# Patient Record
Sex: Male | Born: 1960 | Race: White | Hispanic: No | Marital: Married | Smoking: Current every day smoker
Health system: Southern US, Community
[De-identification: ages and names within clinical notes are randomized; demographics above are authoritative.]

## PROBLEM LIST (undated history)

## (undated) DIAGNOSIS — G8929 Other chronic pain: Secondary | ICD-10-CM

## (undated) DIAGNOSIS — F329 Major depressive disorder, single episode, unspecified: Secondary | ICD-10-CM

## (undated) DIAGNOSIS — R51 Headache: Secondary | ICD-10-CM

## (undated) DIAGNOSIS — G4733 Obstructive sleep apnea (adult) (pediatric): Secondary | ICD-10-CM

## (undated) DIAGNOSIS — H35712 Central serous chorioretinopathy, left eye: Secondary | ICD-10-CM

## (undated) DIAGNOSIS — I10 Essential (primary) hypertension: Secondary | ICD-10-CM

## (undated) DIAGNOSIS — H359 Unspecified retinal disorder: Secondary | ICD-10-CM

## (undated) DIAGNOSIS — M25559 Pain in unspecified hip: Secondary | ICD-10-CM

## (undated) DIAGNOSIS — A749 Chlamydial infection, unspecified: Secondary | ICD-10-CM

## (undated) DIAGNOSIS — Z8669 Personal history of other diseases of the nervous system and sense organs: Secondary | ICD-10-CM

## (undated) DIAGNOSIS — E669 Obesity, unspecified: Secondary | ICD-10-CM

## (undated) DIAGNOSIS — K219 Gastro-esophageal reflux disease without esophagitis: Secondary | ICD-10-CM

## (undated) DIAGNOSIS — F32A Depression, unspecified: Secondary | ICD-10-CM

## (undated) HISTORY — PX: LAMINECTOMY AND MICRODISCECTOMY LUMBAR SPINE: SHX1913

## (undated) HISTORY — PX: HERNIA REPAIR: SHX51

## (undated) HISTORY — PX: ANTERIOR FUSION SPINAL DEFORMITY: SUR35

---

## 2000-08-25 HISTORY — PX: OTHER SURGICAL HISTORY: SHX169

## 2012-07-25 HISTORY — PX: OTHER SURGICAL HISTORY: SHX169

## 2014-07-11 ENCOUNTER — Ambulatory Visit: Payer: Self-pay | Admitting: Unknown Physician Specialty

## 2016-07-25 IMAGING — MR MRI LUMBAR SPINE WITHOUT CONTRAST
4 of 5 series · 24 of 48 positions shown · non-contrast
Comparison: None.

CLINICAL DATA: Low back and left hip and leg pain for 2 years,
worsening over the past year. History of lumbar surgery in 6660.

EXAM:
MRI LUMBAR SPINE WITHOUT CONTRAST
TECHNIQUE: Multiplanar, multisequence MR imaging of the lumbar spine was
performed. No intravenous contrast was administered.

[Series 2: T2 · sagittal · 4.0mm · 0.81mm/px · 6 of 15 slices shown (1 of 2)]
[im 1/15]
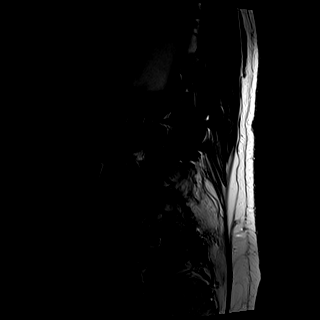
[im 3/15]
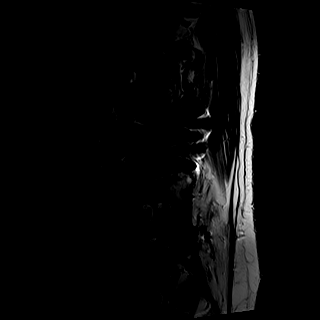
[im 6/15]
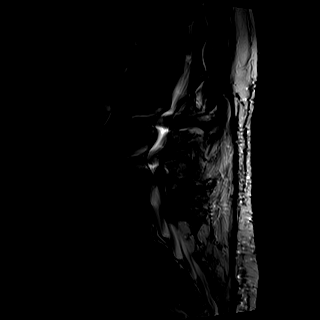
[im 9/15]
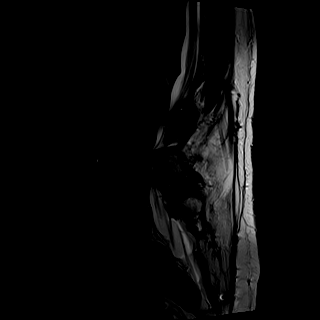
[im 12/15]
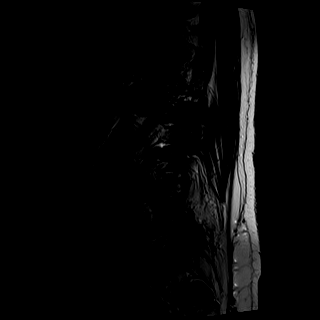
[im 15/15]
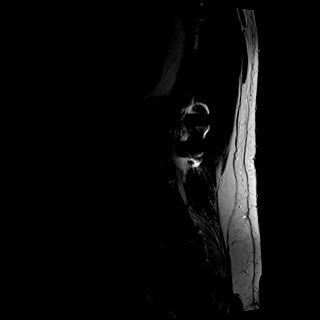

[Series 3: T1 · sagittal · 4.0mm · 0.41mm/px · 6 of 15 slices shown (1 of 2)]
[im 1/15]
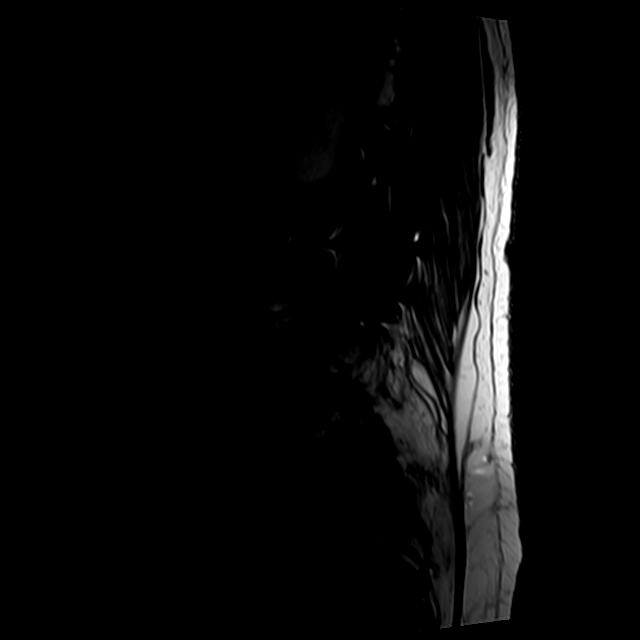
[im 3/15]
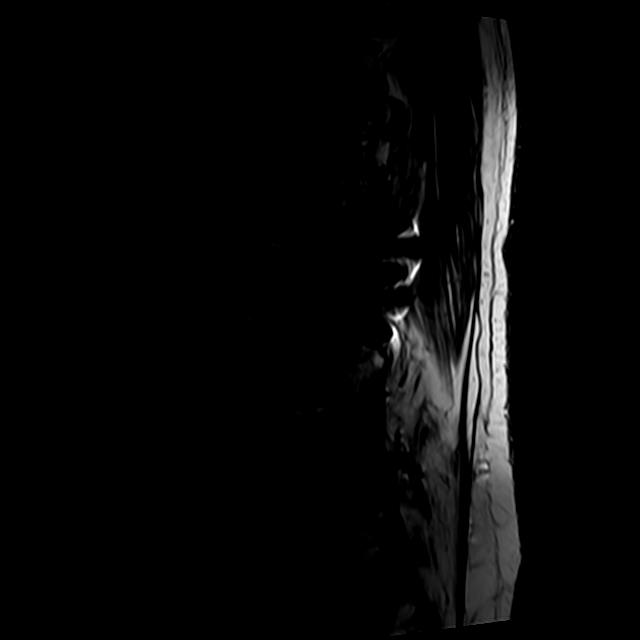
[im 6/15]
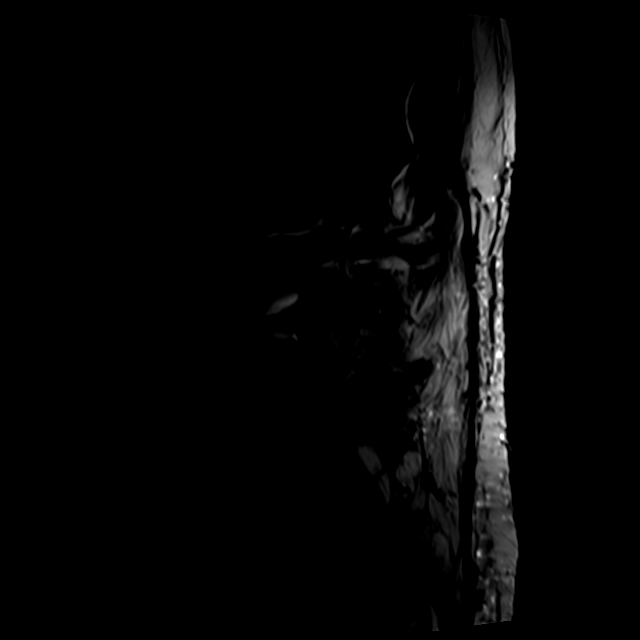
[im 9/15]
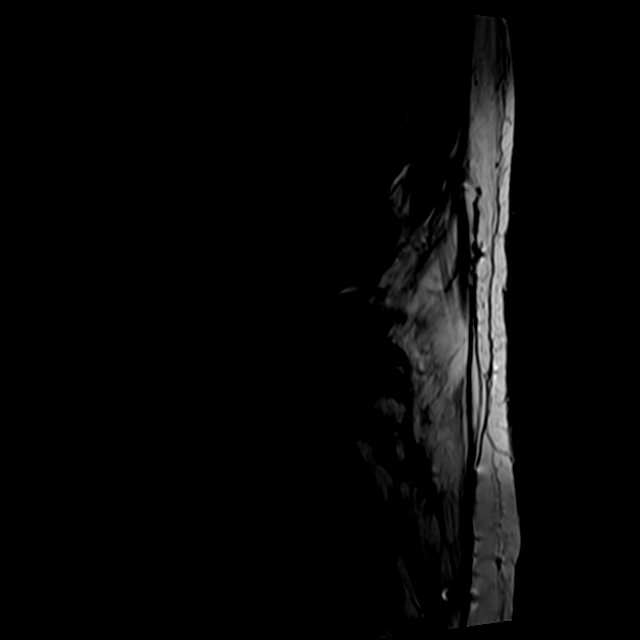
[im 12/15]
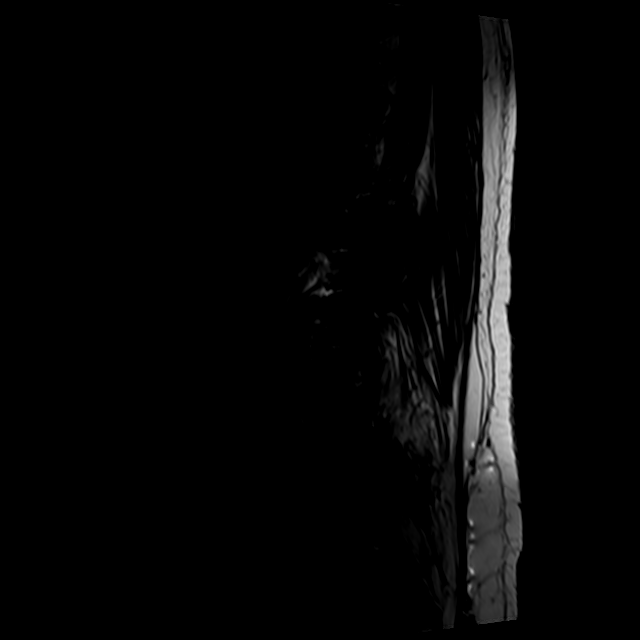
[im 15/15]
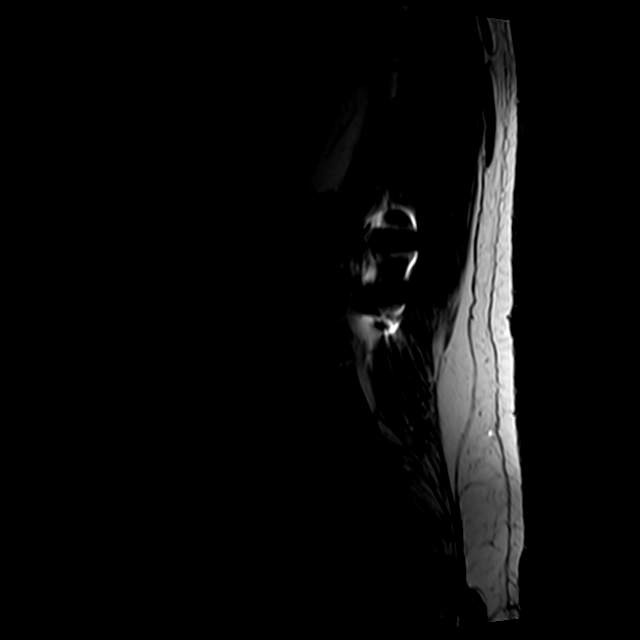

[Series 5: T2 · axial · 4.0mm · 0.78mm/px · z∈[-58,+161]mm · 9 of 39 slices shown (2 of 2)]
[im 1/39]
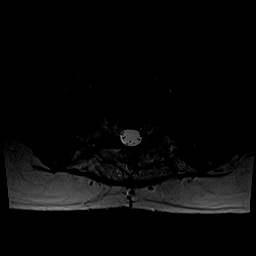
[im 6/39]
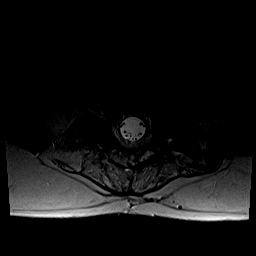
[im 11/39]
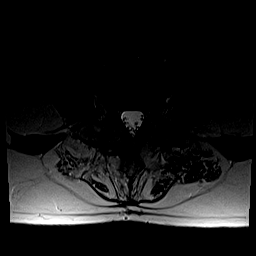
[im 17/39]
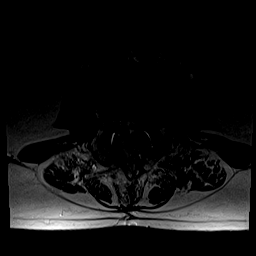
[im 20/39]
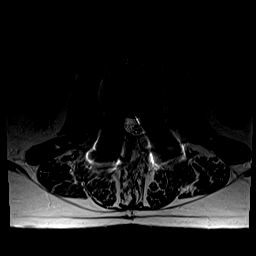
[im 22/39]
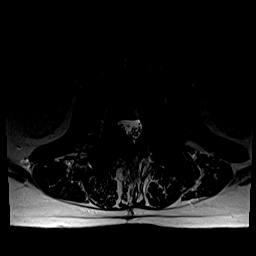
[im 28/39]
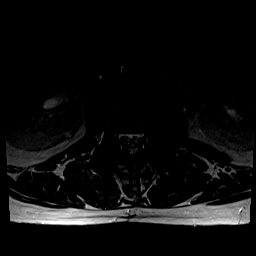
[im 33/39]
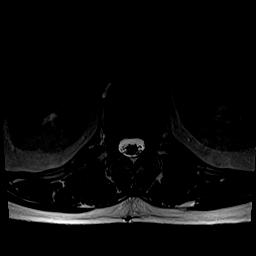
[im 39/39]
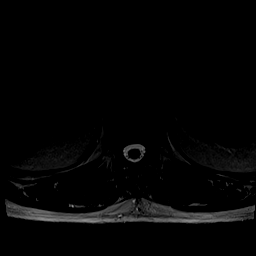

[Series 6: T1 · axial · 4.0mm · 0.31mm/px · z∈[-34,+131]mm · 3 of 39 slices shown (2 of 2)]
[im 6/39]
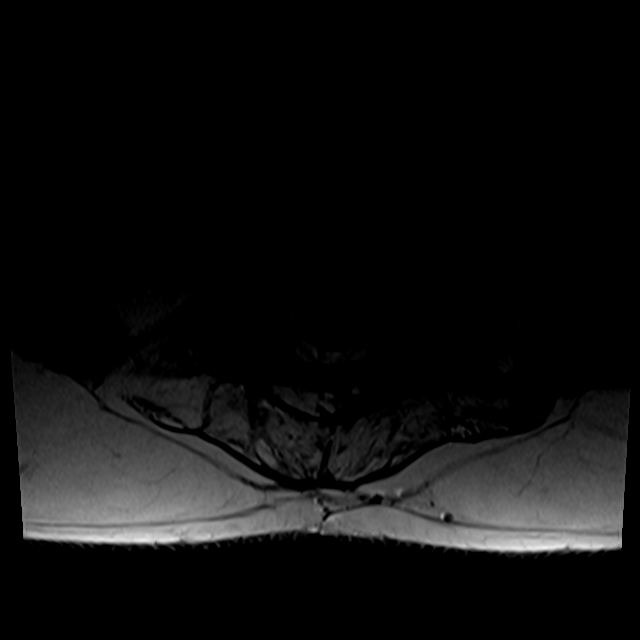
[im 20/39]
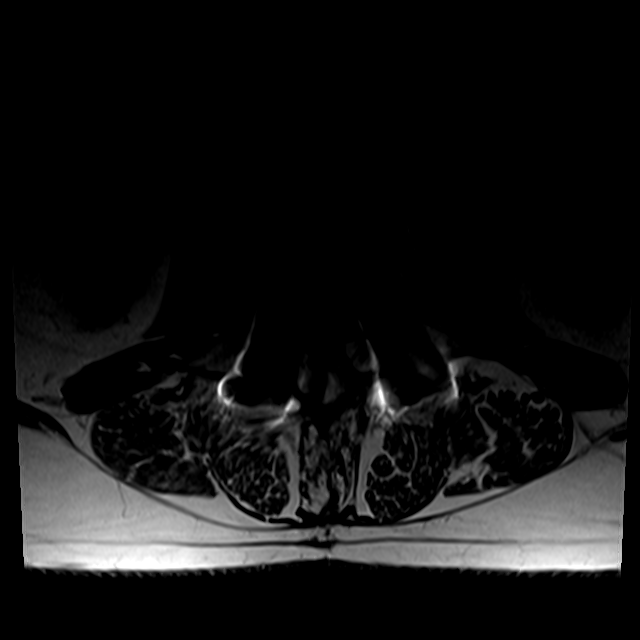
[im 33/39]
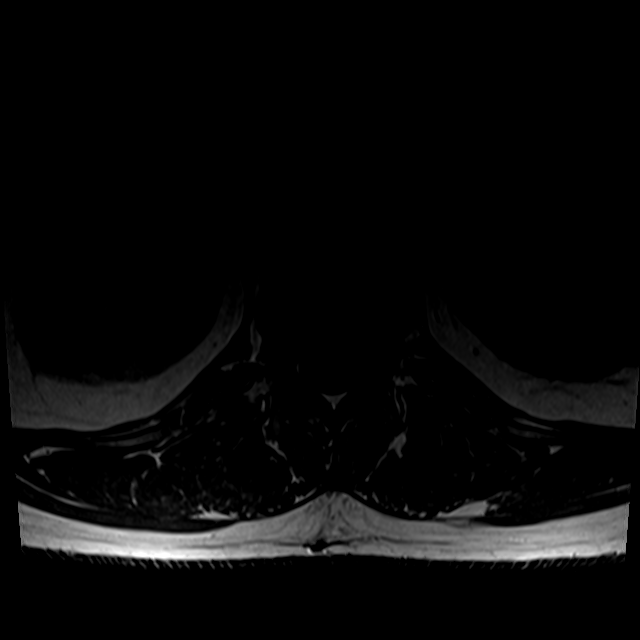

[24 of 48 positions shown; findings below may reference images not displayed]

FINDINGS: Vertebral body height is maintained. There is approximately 0.6 cm
anterolisthesis L5 on S1 and trace retrolisthesis L1 on L2. Pedicle
screws and stabilization bars are in place at L2-3. There is no
worrisome marrow lesion. Vertebral body height is maintained. The
conus medullaris is normal in signal and position. Imaged
intra-abdominal contents are unremarkable.

The T11-12 level is imaged in the sagittal plane only and negative.

T12-L1: Minimal disc bulge without central canal or foraminal
narrowing.

L1-2: Shallow disc bulge without central canal or foraminal
stenosis.

L2-3: Status post fusion. Solid fusion mass posteriorly is
identified. Minimal endplate spurring is present but the central
canal and foramina are widely patent.

L3-4: Moderate to advanced facet degenerative disease is present
with bulky ligamentum flavum thickening. There is a disc bulge.
Severe central canal stenosis is identified. The foramina are open.

L4-5: Solid posterior fusion mass is seen. Minimal disc bulge
without central canal or foraminal narrowing.

L5-S1: Status post laminectomy and fusion. The level is solidly
fused. The central canal and foramina are widely patent.
IMPRESSION: Severe central canal stenosis at L3-4 due to bulky ligamentum flavum
thickening and a shallow disc bulge.

Postoperative change L2-3, L4-5 and L5-S1 without complicating
feature. The central canal and foramina are widely patent.

## 2016-08-12 ENCOUNTER — Encounter: Payer: Self-pay | Admitting: *Deleted

## 2016-08-15 ENCOUNTER — Encounter
Admission: RE | Disposition: A | Discharge status: Home / Self Care | Payer: Self-pay | Source: Ambulatory Visit | Attending: Gastroenterology

## 2016-08-15 ENCOUNTER — Ambulatory Visit
Admission: RE | Admit: 2016-08-15 | Discharge: 2016-08-15 | Disposition: A | Discharge status: Home / Self Care | Payer: Medicare Other | Source: Ambulatory Visit | Attending: Gastroenterology | Admitting: Gastroenterology

## 2016-08-15 ENCOUNTER — Encounter: Payer: Self-pay | Admitting: *Deleted

## 2016-08-15 ENCOUNTER — Ambulatory Visit: Payer: Medicare Other | Admitting: Anesthesiology

## 2016-08-15 DIAGNOSIS — Z79899 Other long term (current) drug therapy: Secondary | ICD-10-CM | POA: Insufficient documentation

## 2016-08-15 DIAGNOSIS — Z6835 Body mass index (BMI) 35.0-35.9, adult: Secondary | ICD-10-CM | POA: Insufficient documentation

## 2016-08-15 DIAGNOSIS — I10 Essential (primary) hypertension: Secondary | ICD-10-CM | POA: Insufficient documentation

## 2016-08-15 DIAGNOSIS — F17200 Nicotine dependence, unspecified, uncomplicated: Secondary | ICD-10-CM | POA: Insufficient documentation

## 2016-08-15 DIAGNOSIS — Z1211 Encounter for screening for malignant neoplasm of colon: Secondary | ICD-10-CM | POA: Insufficient documentation

## 2016-08-15 DIAGNOSIS — G4733 Obstructive sleep apnea (adult) (pediatric): Secondary | ICD-10-CM | POA: Insufficient documentation

## 2016-08-15 DIAGNOSIS — E669 Obesity, unspecified: Secondary | ICD-10-CM | POA: Insufficient documentation

## 2016-08-15 HISTORY — DX: Obstructive sleep apnea (adult) (pediatric): G47.33

## 2016-08-15 HISTORY — DX: Chlamydial infection, unspecified: A74.9

## 2016-08-15 HISTORY — DX: Headache: R51

## 2016-08-15 HISTORY — DX: Major depressive disorder, single episode, unspecified: F32.9

## 2016-08-15 HISTORY — DX: Central serous chorioretinopathy, left eye: H35.712

## 2016-08-15 HISTORY — DX: Depression, unspecified: F32.A

## 2016-08-15 HISTORY — DX: Obesity, unspecified: E66.9

## 2016-08-15 HISTORY — DX: Essential (primary) hypertension: I10

## 2016-08-15 HISTORY — DX: Gastro-esophageal reflux disease without esophagitis: K21.9

## 2016-08-15 HISTORY — DX: Other chronic pain: G89.29

## 2016-08-15 HISTORY — PX: COLONOSCOPY WITH PROPOFOL: SHX5780

## 2016-08-15 SURGERY — COLONOSCOPY WITH PROPOFOL
Anesthesia: General

## 2016-08-15 MED ORDER — PROPOFOL 10 MG/ML IV BOLUS
INTRAVENOUS | Status: DC | PRN
  Administered 2016-08-15: 50 mg via INTRAVENOUS

## 2016-08-15 MED ORDER — PROPOFOL 500 MG/50ML IV EMUL
INTRAVENOUS | Status: AC
  Filled 2016-08-15: qty 50

## 2016-08-15 MED ORDER — SODIUM CHLORIDE 0.9 % IV SOLN
INTRAVENOUS | Status: DC
  Administered 2016-08-15: 1000 mL via INTRAVENOUS
  Administered 2016-08-15: 08:00:00 via INTRAVENOUS

## 2016-08-15 MED ORDER — PROPOFOL 500 MG/50ML IV EMUL
INTRAVENOUS | Status: DC | PRN
  Administered 2016-08-15: 140 ug/kg/min via INTRAVENOUS

## 2016-08-15 MED ORDER — FENTANYL CITRATE (PF) 100 MCG/2ML IJ SOLN
INTRAMUSCULAR | Status: AC
  Filled 2016-08-15: qty 2

## 2016-08-15 MED ORDER — MIDAZOLAM HCL 2 MG/2ML IJ SOLN
INTRAMUSCULAR | Status: AC
  Filled 2016-08-15: qty 2

## 2016-08-15 MED ORDER — MIDAZOLAM HCL 2 MG/2ML IJ SOLN
INTRAMUSCULAR | Status: DC | PRN
Start: 2016-08-15 — End: 2016-08-15
  Administered 2016-08-15: 2 mg via INTRAVENOUS

## 2016-08-15 MED ORDER — FENTANYL CITRATE (PF) 100 MCG/2ML IJ SOLN
INTRAMUSCULAR | Status: DC | PRN
  Administered 2016-08-15: 50 ug via INTRAVENOUS

## 2016-08-15 MED ORDER — SODIUM CHLORIDE 0.9 % IV SOLN: INTRAVENOUS | Status: DC

## 2016-08-15 NOTE — H&P (Signed)
Outpatient short stay form Pre-procedure 08/15/2016 7:27 AM [name] MD  Primary Physician:  Gaetano Net, NP  Reason for visit:  Colonoscopy  History of present illness:  Patient is a 56 year old male presenting today as above. He's never had a colonoscopy in the past. He tolerated his prep well. He takes no aspirin or blood thinning agents.    Current Facility-Administered Medications:  .  0.9 %  sodium chloride infusion, , Intravenous, Continuous, [name], MD, Last Rate: 20 mL/hr at 08/15/16 0704, 1,000 mL at 08/15/16 0704 .  0.9 %  sodium chloride infusion, , Intravenous, Continuous, [name], MD  Prescriptions Prior to Admission  Medication Sig Dispense Refill Last Dose  . cetirizine (ZYRTEC) 10 MG tablet Take 10 mg by mouth daily.   08/14/2016 at Unknown time  . naproxen sodium (ANAPROX) 220 MG tablet Take 220 mg by mouth 2 (two) times daily with a meal. 2 TABLETS BY MOUTH AS NEEDED   08/14/2016 at Unknown time  . valsartan-hydrochlorothiazide (DIOVAN-HCT) 320-25 MG tablet Take 1 tablet by mouth daily.   08/14/2016 at Unknown time     Allergies  Allergen Reactions  . Ace Inhibitors      Past Medical History:  Diagnosis Date  . Central serous retinopathy, left   . Chlamydia FRACTURE OF METACARPAL BONE  . Chronic headaches   . Depression   . GERD (gastroesophageal reflux disease) COLOR BLINDNESS  . Hypertension   . Obesity   . OSA (obstructive sleep apnea)     Review of systems:      Physical Exam    Heart and lungs: Regular rate and rhythm without rub or gallop, lungs are bilaterally clear.    HEENT: Normocephalic atraumatic eyes are anicteric    Other:     Pertinant exam for procedure: Soft nontender nondistended bowel sounds positive normoactive, protuberant.    Planned proceedures: Colonoscopy and indicated procedures. I have discussed the risks benefits and complications of procedures to include not limited to bleeding,  infection, perforation and the risk of sedation and the patient wishes to proceed.    [name], MD Gastroenterology 08/15/2016  7:27 AM

## 2016-08-15 NOTE — Anesthesia Post-op Follow-up Note (Cosign Needed)
Anesthesia QCDR form completed.        

## 2016-08-15 NOTE — Anesthesia Postprocedure Evaluation (Signed)
Anesthesia Post Note  Patient: Steven Downs  Procedure(s) Performed: Procedure(s) (LRB): COLONOSCOPY WITH PROPOFOL (N/A)  Patient location during evaluation: Endoscopy Anesthesia Type: General Level of consciousness: awake and alert Pain management: pain level controlled Vital Signs Assessment: post-procedure vital signs reviewed and stable Respiratory status: spontaneous breathing, nonlabored ventilation, respiratory function stable and patient connected to nasal cannula oxygen Cardiovascular status: blood pressure returned to baseline and stable Postop Assessment: no signs of nausea or vomiting Anesthetic complications: no     Last Vitals:  Vitals:   08/15/16 0810 08/15/16 0820  BP: 123/87 100/68  Pulse: 64 61  Resp: 13 11  Temp:      Last Pain:  Vitals:   08/15/16 0800  TempSrc: Tympanic                 [name]

## 2016-08-15 NOTE — Op Note (Signed)
[facility] Gastroenterology Patient Name: Steven Downs Procedure Date: 08/15/2016 7:34 AM MRN: [id number] Account #: [id number] Date of Birth: [birth year] Admit Type: Outpatient Age: 56 Room: [facility] ENDO ROOM 3 Gender: Male Note Status: Finalized Procedure:            Colonoscopy Indications:          Screening for colorectal malignant neoplasm Providers:            [name], MD Referring MD:         Juluis Rainier (Referring MD) Medicines:            Monitored Anesthesia Care Complications:        No immediate complications. Procedure:            Pre-Anesthesia Assessment:                       - ASA Grade Assessment: III - A patient with severe                        systemic disease.                       After obtaining informed consent, the colonoscope was                        passed under direct vision. Throughout the procedure,                        the patient's blood pressure, pulse, and oxygen                        saturations were monitored continuously. The                        Colonoscope was introduced through the anus with the                        intention of advancing to the cecum. The scope was                        advanced to the sigmoid colon before the procedure was                        aborted. Medications were given. The colonoscopy was                        unusually difficult due to poor bowel prep. The patient                        tolerated the procedure well. The quality of the bowel                        preparation was poor. Findings:      A large amount of semi-liquid stool was found in the rectum, in the       sigmoid colon and in the descending colon, precluding visualization. Impression:           - Preparation of the colon was poor.                       -  Stool in the rectum, in the sigmoid colon and in the                        descending colon.                       - No specimens  collected. Recommendation:       - Discharge patient to home.                       - Put patient on a clear liquid diet starting today.                       - repeat prep and reschedule procedure. Procedure Code(s):    --- Professional ---                       [phone], 46, Colonoscopy, flexible; diagnostic, including                        collection of specimen(s) by brushing or washing, when                        performed (separate procedure) Diagnosis Code(s):    --- Professional ---                       Z12.11, Encounter for screening for malignant neoplasm                        of colon CPT copyright 2016 [employer]. All rights reserved. The codes documented in this report are preliminary and upon coder review may  be revised to meet current compliance requirements. [name], MD 08/15/2016 7:51:51 AM This report has been signed electronically. Number of Addenda: 0 Note Initiated On: 08/15/2016 7:34 AM      [facility]

## 2016-08-15 NOTE — Anesthesia Preprocedure Evaluation (Signed)
Anesthesia Evaluation  Patient identified by MRN, date of birth, ID band Patient awake    Reviewed: Allergy & Precautions, H&P , NPO status , Patient's Chart, lab work & pertinent test results, reviewed documented beta blocker date and time   Airway Mallampati: II  TM Distance: >3 FB Neck ROM: full    Dental  (+) Chipped, Poor Dentition   Pulmonary neg shortness of breath, sleep apnea and Continuous Positive Airway Pressure Ventilation , neg COPD, neg recent URI, Current Smoker,    Pulmonary exam normal breath sounds clear to auscultation       Cardiovascular Exercise Tolerance: Good hypertension, (-) angina(-) CAD, (-) Past MI, (-) Cardiac Stents and (-) CABG Normal cardiovascular exam(-) dysrhythmias + Valvular Problems/Murmurs (as a child)  Rhythm:regular Rate:Normal     Neuro/Psych PSYCHIATRIC DISORDERS (Depression) negative neurological ROS     GI/Hepatic Neg liver ROS, GERD  ,  Endo/Other  negative endocrine ROS  Renal/GU negative Renal ROS  negative genitourinary   Musculoskeletal   Abdominal   Peds  Hematology negative hematology ROS (+)   Anesthesia Other Findings Past Medical History: No date: Central serous retinopathy, left FRACTURE OF METACARPAL BONE: Chlamydia No date: Chronic headaches No date: Depression COLOR BLINDNESS: GERD (gastroesophageal reflux disease) No date: Hypertension No date: Obesity No date: OSA (obstructive sleep apnea)   Reproductive/Obstetrics negative OB ROS                             Anesthesia Physical Anesthesia Plan  ASA: II  Anesthesia Plan: General   Post-op Pain Management:    Induction:   Airway Management Planned:   Additional Equipment:   Intra-op Plan:   Post-operative Plan:   Informed Consent: I have reviewed the patients History and Physical, chart, labs and discussed the procedure including the risks, benefits and  alternatives for the proposed anesthesia with the patient or authorized representative who has indicated his/her understanding and acceptance.   Dental Advisory Given  Plan Discussed with: Anesthesiologist, CRNA and Surgeon  Anesthesia Plan Comments:         Anesthesia Quick Evaluation

## 2016-08-15 NOTE — Transfer of Care (Signed)
Immediate Anesthesia Transfer of Care Note  Patient: Steven Downs  Procedure(s) Performed: Procedure(s): COLONOSCOPY WITH PROPOFOL (N/A)  Patient Location: PACU  Anesthesia Type:General  Level of Consciousness: awake  Airway & Oxygen Therapy: Patient Spontanous Breathing and Patient connected to nasal cannula oxygen  Post-op Assessment: Report given to RN and Post -op Vital signs reviewed and stable  Post vital signs: Reviewed and stable  Last Vitals:  Vitals:   08/15/16 0647  BP: (!) 133/93  Pulse: 70  Resp: 16  Temp: (!) 35.9 C    Last Pain:  Vitals:   08/15/16 0647  TempSrc: Tympanic         Complications: No apparent anesthesia complications

## 2016-08-15 NOTE — Anesthesia Procedure Notes (Signed)
Date/Time: 08/15/2016 7:40 AM Performed by: Allean Found Pre-anesthesia Checklist: Patient identified, Emergency Drugs available, Suction available, Patient being monitored and Timeout performed Patient Re-evaluated:Patient Re-evaluated prior to inductionOxygen Delivery Method: Nasal cannula Intubation Type: IV induction

## 2016-08-16 ENCOUNTER — Encounter
Admission: RE | Disposition: A | Discharge status: Home / Self Care | Payer: Self-pay | Source: Ambulatory Visit | Attending: Gastroenterology

## 2016-08-16 ENCOUNTER — Ambulatory Visit: Payer: Medicare Other | Admitting: Anesthesiology

## 2016-08-16 ENCOUNTER — Encounter: Payer: Self-pay | Admitting: Gastroenterology

## 2016-08-16 ENCOUNTER — Ambulatory Visit
Admission: RE | Admit: 2016-08-16 | Discharge: 2016-08-16 | Disposition: A | Discharge status: Home / Self Care | Payer: Medicare Other | Source: Ambulatory Visit | Attending: Gastroenterology | Admitting: Gastroenterology

## 2016-08-16 DIAGNOSIS — Z79899 Other long term (current) drug therapy: Secondary | ICD-10-CM | POA: Diagnosis not present

## 2016-08-16 DIAGNOSIS — Z1211 Encounter for screening for malignant neoplasm of colon: Secondary | ICD-10-CM | POA: Diagnosis present

## 2016-08-16 DIAGNOSIS — G4733 Obstructive sleep apnea (adult) (pediatric): Secondary | ICD-10-CM | POA: Insufficient documentation

## 2016-08-16 DIAGNOSIS — D125 Benign neoplasm of sigmoid colon: Secondary | ICD-10-CM | POA: Insufficient documentation

## 2016-08-16 DIAGNOSIS — D124 Benign neoplasm of descending colon: Secondary | ICD-10-CM | POA: Insufficient documentation

## 2016-08-16 DIAGNOSIS — Z6835 Body mass index (BMI) 35.0-35.9, adult: Secondary | ICD-10-CM | POA: Insufficient documentation

## 2016-08-16 DIAGNOSIS — F17200 Nicotine dependence, unspecified, uncomplicated: Secondary | ICD-10-CM | POA: Diagnosis not present

## 2016-08-16 DIAGNOSIS — I10 Essential (primary) hypertension: Secondary | ICD-10-CM | POA: Insufficient documentation

## 2016-08-16 DIAGNOSIS — K635 Polyp of colon: Secondary | ICD-10-CM | POA: Insufficient documentation

## 2016-08-16 DIAGNOSIS — K5730 Diverticulosis of large intestine without perforation or abscess without bleeding: Secondary | ICD-10-CM | POA: Insufficient documentation

## 2016-08-16 DIAGNOSIS — K621 Rectal polyp: Secondary | ICD-10-CM | POA: Diagnosis not present

## 2016-08-16 HISTORY — PX: COLONOSCOPY WITH PROPOFOL: SHX5780

## 2016-08-16 SURGERY — COLONOSCOPY WITH PROPOFOL
Anesthesia: General

## 2016-08-16 MED ORDER — MIDAZOLAM HCL 2 MG/2ML IJ SOLN
INTRAMUSCULAR | Status: AC
  Filled 2016-08-16: qty 2

## 2016-08-16 MED ORDER — FENTANYL CITRATE (PF) 100 MCG/2ML IJ SOLN
INTRAMUSCULAR | Status: DC | PRN
  Administered 2016-08-16: 50 ug via INTRAVENOUS

## 2016-08-16 MED ORDER — LIDOCAINE HCL (PF) 2 % IJ SOLN
INTRAMUSCULAR | Status: AC
  Filled 2016-08-16: qty 2

## 2016-08-16 MED ORDER — PROPOFOL 500 MG/50ML IV EMUL
INTRAVENOUS | Status: AC
  Filled 2016-08-16: qty 50

## 2016-08-16 MED ORDER — SODIUM CHLORIDE 0.9 % IV SOLN
INTRAVENOUS | Status: DC
  Administered 2016-08-16: 1000 mL via INTRAVENOUS

## 2016-08-16 MED ORDER — PROPOFOL 500 MG/50ML IV EMUL
INTRAVENOUS | Status: DC | PRN
  Administered 2016-08-16: 140 ug/kg/min via INTRAVENOUS

## 2016-08-16 MED ORDER — PHENYLEPHRINE HCL 10 MG/ML IJ SOLN
INTRAMUSCULAR | Status: AC
  Filled 2016-08-16: qty 1

## 2016-08-16 MED ORDER — LIDOCAINE 2% (20 MG/ML) 5 ML SYRINGE
INTRAMUSCULAR | Status: DC | PRN
  Administered 2016-08-16: 40 mg via INTRAVENOUS

## 2016-08-16 MED ORDER — MIDAZOLAM HCL 5 MG/5ML IJ SOLN
INTRAMUSCULAR | Status: DC | PRN
  Administered 2016-08-16: 1 mg via INTRAVENOUS

## 2016-08-16 MED ORDER — FENTANYL CITRATE (PF) 100 MCG/2ML IJ SOLN
INTRAMUSCULAR | Status: AC
  Filled 2016-08-16: qty 2

## 2016-08-16 MED ORDER — PHENYLEPHRINE HCL 10 MG/ML IJ SOLN
INTRAMUSCULAR | Status: DC | PRN
  Administered 2016-08-16: 100 ug via INTRAVENOUS

## 2016-08-16 MED ORDER — PROPOFOL 10 MG/ML IV BOLUS
INTRAVENOUS | Status: DC | PRN
  Administered 2016-08-16: 100 mg via INTRAVENOUS

## 2016-08-16 MED ORDER — SODIUM CHLORIDE 0.9 % IV SOLN
INTRAVENOUS | Status: DC
  Administered 2016-08-16 (×2): via INTRAVENOUS

## 2016-08-16 NOTE — Anesthesia Preprocedure Evaluation (Signed)
Anesthesia Evaluation  Patient identified by MRN, date of birth, ID band Patient awake    Reviewed: Allergy & Precautions, NPO status , Patient's Chart, lab work & pertinent test results  History of Anesthesia Complications Negative for: history of anesthetic complications  Airway Mallampati: II  TM Distance: >3 FB Neck ROM: Full    Dental  (+) Poor Dentition   Pulmonary sleep apnea , neg COPD, Current Smoker,    breath sounds clear to auscultation- rhonchi (-) wheezing      Cardiovascular Exercise Tolerance: Good hypertension, Pt. on medications (-) CAD and (-) Past MI  Rhythm:Regular Rate:Normal - Systolic murmurs and - Diastolic murmurs    Neuro/Psych  Headaches, Depression    GI/Hepatic Neg liver ROS, GERD  ,  Endo/Other  negative endocrine ROSneg diabetes  Renal/GU negative Renal ROS     Musculoskeletal negative musculoskeletal ROS (+)   Abdominal (+) + obese,   Peds  Hematology negative hematology ROS (+)   Anesthesia Other Findings Past Medical History: No date: Central serous retinopathy, left FRACTURE OF METACARPAL BONE: Chlamydia No date: Chronic headaches No date: Depression COLOR BLINDNESS: GERD (gastroesophageal reflux disease) No date: Hypertension No date: Obesity No date: OSA (obstructive sleep apnea)   Reproductive/Obstetrics                             Anesthesia Physical Anesthesia Plan  ASA: III  Anesthesia Plan: General   Post-op Pain Management:    Induction: Intravenous  Airway Management Planned: Natural Airway  Additional Equipment:   Intra-op Plan:   Post-operative Plan:   Informed Consent: I have reviewed the patients History and Physical, chart, labs and discussed the procedure including the risks, benefits and alternatives for the proposed anesthesia with the patient or authorized representative who has indicated his/her understanding and  acceptance.   Dental advisory given  Plan Discussed with: CRNA and Anesthesiologist  Anesthesia Plan Comments:         Anesthesia Quick Evaluation

## 2016-08-16 NOTE — Op Note (Signed)
[facility] Gastroenterology Patient Name: Steven Downs Procedure Date: 08/16/2016 10:21 AM MRN: [id number] Account #: [id number] Date of Birth: 1961/04/14 Admit Type: Outpatient Age: 56 Room: [facility] ENDO ROOM 3 Gender: Male Note Status: Finalized Procedure:            Colonoscopy Indications:          Screening for colorectal malignant neoplasm Providers:            [name], MD Referring MD:         Juluis Rainier (Referring MD) Medicines:            Monitored Anesthesia Care Complications:        No immediate complications. Procedure:            Pre-Anesthesia Assessment:                       - ASA Grade Assessment: III - A patient with severe                        systemic disease.                       After obtaining informed consent, the colonoscope was                        passed under direct vision. Throughout the procedure,                        the patient's blood pressure, pulse, and oxygen                        saturations were monitored continuously. The                        Colonoscope was introduced through the anus and                        advanced to the the cecum, identified by appendiceal                        orifice and ileocecal valve. The colonoscopy was                        performed without difficulty. The patient tolerated the                        procedure well. The quality of the bowel preparation                        was good. Findings:      A few small-mouthed diverticula were found in the sigmoid colon and       descending colon.      A 4 mm polyp was found in the mid descending colon. The polyp was       sessile. The polyp was removed with a cold snare. Resection and       retrieval were complete.      A 3 mm polyp was found in the proximal ascending colon. The polyp was       flat. [Method]. [Resection & Retrieval]. The polyp was removed with a  cold biopsy forceps. Resection and retrieval were  complete.      A 18 mm polyp was found in the distal descending colon. The polyp was       pedunculated. The polyp was removed with a hot snare. Resection and       retrieval were complete. To prevent bleeding after the polypectomy, one       hemostatic clip was successfully placed. There was no bleeding at the       end of the maneuver.      Two sessile polyps were found in the sigmoid colon. The polyps were 3 to       4 mm in size. These polyps were removed with a cold snare. Resection and       retrieval were complete.      Three sessile polyps were found in the rectum. The polyps were 1 to 3 mm       in size. These polyps were removed with a cold biopsy forceps. Resection       and retrieval were complete.      The retroflexed view of the distal rectum and anal verge was normal and       showed no anal or rectal abnormalities. Impression:           - Diverticulosis in the sigmoid colon and in the                        descending colon.                       - One 4 mm polyp in the mid descending colon, removed                        with a cold snare. Resected and retrieved.                       - One 3 mm polyp in the proximal ascending colon, SKIP                        and removed with a cold biopsy forceps. Resected and                        retrieved.                       - One 18 mm polyp in the distal descending colon,                        removed with a hot snare. Resected and retrieved. Clip                        was placed.                       - Two 3 to 4 mm polyps in the sigmoid colon, removed                        with a cold snare. Resected and retrieved.                       - Three 1 to 3 mm polyps in the rectum, removed with a  cold biopsy forceps. Resected and retrieved.                       - The distal rectum and anal verge are normal on                        retroflexion view. Recommendation:       - Discharge patient to  home.                       - Full liquid diet for 2 days, then advance as                        tolerated to low residue diet for 3 days. Procedure Code(s):    --- Professional ---                       [phone], Colonoscopy, flexible; with removal of tumor(s),                        polyp(s), or other lesion(s) by snare technique                       [id number], 38, Colonoscopy, flexible; with biopsy, single                        or multiple Diagnosis Code(s):    --- Professional ---                       Z12.11, Encounter for screening for malignant neoplasm                        of colon                       D12.2, Benign neoplasm of ascending colon                       D12.4, Benign neoplasm of descending colon                       D12.5, Benign neoplasm of sigmoid colon                       K62.1, Rectal polyp                       K57.30, Diverticulosis of large intestine without                        perforation or abscess without bleeding CPT copyright 2016 American Medical Association. All rights reserved. The codes documented in this report are preliminary and upon coder review may  be revised to meet current compliance requirements. [name], MD 08/16/2016 11:35:54 AM This report has been signed electronically. Number of Addenda: 0 Note Initiated On: 08/16/2016 10:21 AM Scope Withdrawal Time: 0 hours 12 minutes 17 seconds  Total Procedure Duration: 0 hours 47 minutes 10 seconds       [facility]

## 2016-08-16 NOTE — Transfer of Care (Signed)
Immediate Anesthesia Transfer of Care Note  Patient: Steven Downs  Procedure(s) Performed: Procedure(s): COLONOSCOPY WITH PROPOFOL (N/A)  Patient Location: PACU and Endoscopy Unit  Anesthesia Type:General  Level of Consciousness: sedated  Airway & Oxygen Therapy: Patient Spontanous Breathing and Patient connected to nasal cannula oxygen  Post-op Assessment: Report given to RN and Post -op Vital signs reviewed and stable  Post vital signs: Reviewed and stable  Last Vitals:  Vitals:   08/16/16 0831  BP: 138/77  Pulse: 76  Resp: (!) 22  Temp: (!) 35.7 C    Last Pain:  Vitals:   08/16/16 0831  TempSrc: Tympanic  PainSc: 4          Complications: No apparent anesthesia complications

## 2016-08-16 NOTE — H&P (Signed)
Outpatient short stay form Pre-procedure 08/16/2016 8:49 AM [name] MD  Primary Physician: Gaetano Net, NP  Reason for visit:  Colonoscopy  History of present illness:  Patient is a 56 year old male presenting today for screening colonoscopy. Gastricae min yesterday however is prep was not adequate and required repeat prep. He takes no aspirin or blood thinning agents. He tolerated his prep overnight.    Current Facility-Administered Medications:  .  0.9 %  sodium chloride infusion, , Intravenous, Continuous, [name], MD, Last Rate: 20 mL/hr at 08/16/16 0840, 1,000 mL at 08/16/16 0840 .  0.9 %  sodium chloride infusion, , Intravenous, Continuous, [name], MD  Prescriptions Prior to Admission  Medication Sig Dispense Refill Last Dose  . cetirizine (ZYRTEC) 10 MG tablet Take 10 mg by mouth daily.   08/15/2016 at Unknown time  . naproxen sodium (ANAPROX) 220 MG tablet Take 220 mg by mouth 2 (two) times daily with a meal. 2 TABLETS BY MOUTH AS NEEDED   08/15/2016 at Unknown time  . valsartan-hydrochlorothiazide (DIOVAN-HCT) 320-25 MG tablet Take 1 tablet by mouth daily.   08/15/2016 at Unknown time     Allergies  Allergen Reactions  . Ace Inhibitors      Past Medical History:  Diagnosis Date  . Central serous retinopathy, left   . Chlamydia FRACTURE OF METACARPAL BONE  . Chronic headaches   . Depression   . GERD (gastroesophageal reflux disease) COLOR BLINDNESS  . Hypertension   . Obesity   . OSA (obstructive sleep apnea)     Review of systems:      Physical Exam    Heart and lungs: Regular rate and rhythm without rub or gallop, lungs are bilaterally clear.    HEENT: Normocephalic atraumatic eyes are anicteric    Other:     Pertinant exam for procedure: Soft nontender nondistended bowel sounds positive normoactive.    Planned proceedures: Colonoscopy and indicated procedures. I have discussed the risks benefits and complications of  procedures to include not limited to bleeding, infection, perforation and the risk of sedation and the patient wishes to proceed.    [name], MD Gastroenterology 08/16/2016  8:49 AM

## 2016-08-16 NOTE — Anesthesia Post-op Follow-up Note (Cosign Needed)
Anesthesia QCDR form completed.        

## 2016-08-17 LAB — SURGICAL PATHOLOGY

## 2016-08-17 NOTE — Anesthesia Postprocedure Evaluation (Addendum)
Anesthesia Post Note  Patient: Steven Downs  Procedure(s) Performed: Procedure(s) (LRB): COLONOSCOPY WITH PROPOFOL (N/A)  Patient location during evaluation: Endoscopy Anesthesia Type: General Level of consciousness: awake and alert Pain management: pain level controlled Vital Signs Assessment: post-procedure vital signs reviewed and stable Respiratory status: spontaneous breathing, nonlabored ventilation, respiratory function stable and patient connected to nasal cannula oxygen Cardiovascular status: blood pressure returned to baseline and stable Postop Assessment: no signs of nausea or vomiting Anesthetic complications: no     Last Vitals:  Vitals:   08/16/16 1200 08/16/16 1208  BP: 127/80 (!) 137/91  Pulse: (!) 58 (!) 59  Resp: 18 14  Temp:      Last Pain:  Vitals:   08/16/16 1138  TempSrc: Tympanic  PainSc:                  [name]

## 2016-08-17 NOTE — Addendum Note (Signed)
Addendum  created 08/17/16 1145 by [name], MD   Sign clinical note

## 2016-08-18 ENCOUNTER — Encounter: Payer: Self-pay | Admitting: Gastroenterology

## 2016-12-14 ENCOUNTER — Encounter: Payer: Self-pay | Admitting: *Deleted

## 2016-12-15 ENCOUNTER — Ambulatory Visit: Payer: Medicare Other | Admitting: Certified Registered"

## 2016-12-15 ENCOUNTER — Encounter
Admission: RE | Disposition: A | Discharge status: Home / Self Care | Payer: Self-pay | Source: Ambulatory Visit | Attending: Gastroenterology

## 2016-12-15 ENCOUNTER — Ambulatory Visit
Admission: RE | Admit: 2016-12-15 | Discharge: 2016-12-15 | Disposition: A | Discharge status: Home / Self Care | Payer: Medicare Other | Source: Ambulatory Visit | Attending: Gastroenterology | Admitting: Gastroenterology

## 2016-12-15 ENCOUNTER — Encounter: Payer: Self-pay | Admitting: *Deleted

## 2016-12-15 DIAGNOSIS — Z79899 Other long term (current) drug therapy: Secondary | ICD-10-CM | POA: Insufficient documentation

## 2016-12-15 DIAGNOSIS — Z86010 Personal history of colonic polyps: Secondary | ICD-10-CM | POA: Diagnosis not present

## 2016-12-15 DIAGNOSIS — Z6835 Body mass index (BMI) 35.0-35.9, adult: Secondary | ICD-10-CM | POA: Diagnosis not present

## 2016-12-15 DIAGNOSIS — G4733 Obstructive sleep apnea (adult) (pediatric): Secondary | ICD-10-CM | POA: Insufficient documentation

## 2016-12-15 DIAGNOSIS — E669 Obesity, unspecified: Secondary | ICD-10-CM | POA: Insufficient documentation

## 2016-12-15 DIAGNOSIS — Z888 Allergy status to other drugs, medicaments and biological substances status: Secondary | ICD-10-CM | POA: Diagnosis not present

## 2016-12-15 DIAGNOSIS — Z09 Encounter for follow-up examination after completed treatment for conditions other than malignant neoplasm: Secondary | ICD-10-CM | POA: Diagnosis present

## 2016-12-15 DIAGNOSIS — I10 Essential (primary) hypertension: Secondary | ICD-10-CM | POA: Diagnosis not present

## 2016-12-15 HISTORY — DX: Unspecified retinal disorder: H35.9

## 2016-12-15 HISTORY — DX: Personal history of other diseases of the nervous system and sense organs: Z86.69

## 2016-12-15 HISTORY — PX: FLEXIBLE SIGMOIDOSCOPY: SHX5431

## 2016-12-15 HISTORY — DX: Pain in unspecified hip: M25.559

## 2016-12-15 SURGERY — SIGMOIDOSCOPY, FLEXIBLE
Anesthesia: General

## 2016-12-15 MED ORDER — MIDAZOLAM HCL 2 MG/2ML IJ SOLN
INTRAMUSCULAR | Status: AC
  Filled 2016-12-15: qty 2

## 2016-12-15 MED ORDER — PROPOFOL 10 MG/ML IV BOLUS
INTRAVENOUS | Status: DC | PRN
  Administered 2016-12-15: 70 mg via INTRAVENOUS

## 2016-12-15 MED ORDER — LIDOCAINE HCL 2 % IJ SOLN
INTRAMUSCULAR | Status: AC
  Filled 2016-12-15: qty 10

## 2016-12-15 MED ORDER — MIDAZOLAM HCL 2 MG/2ML IJ SOLN
INTRAMUSCULAR | Status: DC | PRN
  Administered 2016-12-15: 2 mg via INTRAVENOUS

## 2016-12-15 MED ORDER — LIDOCAINE HCL (CARDIAC) 20 MG/ML IV SOLN
INTRAVENOUS | Status: DC | PRN
  Administered 2016-12-15: 50 mg via INTRAVENOUS

## 2016-12-15 MED ORDER — PROPOFOL 500 MG/50ML IV EMUL
INTRAVENOUS | Status: DC | PRN
  Administered 2016-12-15: 150 ug/kg/min via INTRAVENOUS

## 2016-12-15 MED ORDER — GLYCOPYRROLATE 0.2 MG/ML IJ SOLN
INTRAMUSCULAR | Status: AC
  Filled 2016-12-15: qty 1

## 2016-12-15 MED ORDER — SODIUM CHLORIDE 0.9 % IV SOLN: INTRAVENOUS | Status: DC

## 2016-12-15 MED ORDER — SODIUM CHLORIDE 0.9 % IV SOLN
INTRAVENOUS | Status: DC
  Administered 2016-12-15 (×2): via INTRAVENOUS

## 2016-12-15 MED ORDER — PROPOFOL 10 MG/ML IV BOLUS
INTRAVENOUS | Status: AC
  Filled 2016-12-15: qty 20

## 2016-12-15 NOTE — H&P (Signed)
Outpatient short stay form Pre-procedure 12/15/2016 11:03 AM [name] MD  Primary Physician: Mercy Riding NP  Reason for visit:  Flexible sigmoidoscopy  History of present illness:  Patient is a 56 year old male presenting today as above. He had a colonoscopy on 08/16/2016. At that time multiple polyps were removed one of these being about 18 mm from the distal descending colon. This was pedunculated required a clip. 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 He is presenting today to recheck this site. He takes no aspirin or blood thinning agents. He is ASA 3 with sleep apnea and I have requested anesthesia assistance.    Current Facility-Administered Medications:  .  0.9 %  sodium chloride infusion, , Intravenous, Continuous, [name], MD, Last Rate: 20 mL/hr at 12/15/16 1001 .  0.9 %  sodium chloride infusion, , Intravenous, Continuous, [name], MD  Prescriptions Prior to Admission  Medication Sig Dispense Refill Last Dose  . cetirizine (ZYRTEC) 10 MG tablet Take 10 mg by mouth daily.   12/15/2016 at Unknown time  . naproxen sodium (ANAPROX) 220 MG tablet Take 220 mg by mouth 2 (two) times daily with a meal. 2 TABLETS BY MOUTH AS NEEDED   12/14/2016 at Unknown time  . valsartan-hydrochlorothiazide (DIOVAN-HCT) 320-25 MG tablet Take 1 tablet by mouth daily.   12/15/2016 at Unknown time     Allergies  Allergen Reactions  . Ace Inhibitors      Past Medical History:  Diagnosis Date  . Central serous retinopathy, left   . Chlamydia FRACTURE OF METACARPAL BONE  . Chronic headaches   . Depression   . GERD (gastroesophageal reflux disease) COLOR BLINDNESS  . Hip pain   . History of color blindness   . Hypertension   . Maculopathy   . Obesity   . OSA (obstructive sleep apnea)     Review of systems:      Physical Exam    Heart and lungs: Regular rate and rhythm without rub or gallop, lungs are bilaterally clear  to auscultation    HEENT: Normocephalic atraumatic eyes are anicteric    Other:     Pertinant exam for procedure: Soft nontender nondistended bowel sounds positive normoactive.    Planned proceedures: Sedated flexible sigmoidoscopy. I've chosen anesthesia assistance for this case because his status of ASA 3 and sleep apnea. I have discussed the risks benefits and complications of procedures to include not limited to bleeding, infection, perforation and the risk of sedation and the patient wishes to proceed.    [name], MD Gastroenterology 12/15/2016  11:03 AM

## 2016-12-15 NOTE — Transfer of Care (Signed)
Immediate Anesthesia Transfer of Care Note  Patient: Fitz Matsuo  Procedure(s) Performed: Procedure(s): FLEXIBLE SIGMOIDOSCOPY (N/A)  Patient Location: PACU  Anesthesia Type:General  Level of Consciousness: sedated and responds to stimulation  Airway & Oxygen Therapy: Patient Spontanous Breathing and Patient connected to nasal cannula oxygen  Post-op Assessment: Report given to RN and Post -op Vital signs reviewed and stable  Post vital signs: Reviewed and stable  Last Vitals:  Vitals:   12/15/16 0937 12/15/16 1128  BP: (!) 140/106 (!) 127/56  Pulse: 70 67  Resp: 18 19  Temp: (!) 35.8 C     Last Pain:  Vitals:   12/15/16 0937  TempSrc: Tympanic         Complications: No apparent anesthesia complications

## 2016-12-15 NOTE — Op Note (Signed)
[facility] Gastroenterology Patient Name: Steven Downs Procedure Date: 12/15/2016 11:04 AM MRN: [id number] Account #: [id number] Date of Birth: 15-Jul-1961 Admit Type: Outpatient Age: 56 Room: [facility] ENDO ROOM 1 Gender: Male Note Status: Finalized Procedure:            Flexible Sigmoidoscopy Providers:            [name], MD Referring MD:         Juluis Rainier (Referring MD) Medicines:            Monitored Anesthesia Care Complications:        No immediate complications. Procedure:            Pre-Anesthesia Assessment:                       - ASA Grade Assessment: III - A patient with severe                        systemic disease.                       After obtaining informed consent, the scope was passed                        under direct vision. The Colonoscope was introduced                        through the anus The procedure was aborted. The scope                        was not inserted. splenic flexure Medications were                        descending colon. The flexible sigmoidoscopy was                        unusually difficult due to poor bowel prep with stool                        present. The patient tolerated the procedure well. The                        quality of the bowel preparation was poor. Findings:      The scope was advanced to about 50 to 60 cm. I was unable to fully       evaluate the lumen due to a large amount of stool present beginning in       the rectum and continuing more proximally. The scope was removed. Impression:           - Preparation of the colon was poor.                       - No specimens collected. Recommendation:       - reschedule with full colonoscopy prep. Procedure Code(s):    --- Professional ---                       [phone], 52, Sigmoidoscopy, flexible; diagnostic,                        including collection of specimen(s) by  brushing or                        washing, when performed (separate  procedure) CPT copyright 2016 American [employer]. All rights reserved. The codes documented in this report are preliminary and upon coder review may  be revised to meet current compliance requirements. [name], MD 12/15/2016 11:25:36 AM This report has been signed electronically. Number of Addenda: 0 Note Initiated On: 12/15/2016 11:04 AM Total Procedure Duration: 0 hours 3 minutes 11 seconds       [facility]

## 2016-12-15 NOTE — Anesthesia Procedure Notes (Addendum)
Performed by: Lance Muss Pre-anesthesia Checklist: Patient identified, Emergency Drugs available, Suction available, Patient being monitored and Timeout performed Patient Re-evaluated:Patient Re-evaluated prior to inductionOxygen Delivery Method: Nasal cannula Preoxygenation: Pre-oxygenation with 100% oxygen Intubation Type: IV induction Ventilation: Nasal airway inserted- appropriate to patient size

## 2016-12-15 NOTE — Anesthesia Post-op Follow-up Note (Cosign Needed)
Anesthesia QCDR form completed.        

## 2016-12-15 NOTE — Anesthesia Postprocedure Evaluation (Signed)
Anesthesia Post Note  Patient: Latonya Knight  Procedure(s) Performed: Procedure(s) (LRB): FLEXIBLE SIGMOIDOSCOPY (N/A)  Patient location during evaluation: Endoscopy Anesthesia Type: General Level of consciousness: awake and alert and oriented Pain management: pain level controlled Vital Signs Assessment: post-procedure vital signs reviewed and stable Respiratory status: spontaneous breathing, nonlabored ventilation and respiratory function stable Cardiovascular status: blood pressure returned to baseline and stable Postop Assessment: no signs of nausea or vomiting Anesthetic complications: no     Last Vitals:  Vitals:   12/15/16 1138 12/15/16 1148  BP: 106/84 122/85  Pulse: 63 65  Resp: 15 17  Temp:      Last Pain:  Vitals:   12/15/16 1120  TempSrc: Tympanic                 Rodricus Candelaria

## 2016-12-15 NOTE — Anesthesia Preprocedure Evaluation (Signed)
Anesthesia Evaluation  Patient identified by MRN, date of birth, ID band Patient awake    Reviewed: Allergy & Precautions, NPO status , Patient's Chart, lab work & pertinent test results  History of Anesthesia Complications Negative for: history of anesthetic complications  Airway Mallampati: II  TM Distance: >3 FB Neck ROM: Full    Dental  (+) Missing   Pulmonary sleep apnea and Continuous Positive Airway Pressure Ventilation , Current Smoker,    breath sounds clear to auscultation- rhonchi (-) wheezing      Cardiovascular hypertension, Pt. on medications (-) CAD and (-) Past MI  Rhythm:Regular Rate:Normal - Systolic murmurs and - Diastolic murmurs    Neuro/Psych  Headaches, PSYCHIATRIC DISORDERS Depression    GI/Hepatic Neg liver ROS, GERD  ,  Endo/Other  negative endocrine ROSneg diabetes  Renal/GU negative Renal ROS     Musculoskeletal negative musculoskeletal ROS (+)   Abdominal (+) + obese,   Peds  Hematology negative hematology ROS (+)   Anesthesia Other Findings Past Medical History: No date: Central serous retinopathy, left FRACTURE OF METACARPAL BONE: Chlamydia No date: Chronic headaches No date: Depression COLOR BLINDNESS: GERD (gastroesophageal reflux disease) No date: Hip pain No date: History of color blindness No date: Hypertension No date: Maculopathy No date: Obesity No date: OSA (obstructive sleep apnea)   Reproductive/Obstetrics                             Anesthesia Physical Anesthesia Plan  ASA: III  Anesthesia Plan: General   Post-op Pain Management:    Induction: Intravenous  Airway Management Planned: Natural Airway  Additional Equipment:   Intra-op Plan:   Post-operative Plan:   Informed Consent: I have reviewed the patients History and Physical, chart, labs and discussed the procedure including the risks, benefits and alternatives for the  proposed anesthesia with the patient or authorized representative who has indicated his/her understanding and acceptance.   Dental advisory given  Plan Discussed with: CRNA and Anesthesiologist  Anesthesia Plan Comments:         Anesthesia Quick Evaluation

## 2016-12-16 ENCOUNTER — Encounter: Payer: Self-pay | Admitting: Gastroenterology

## 2017-02-16 ENCOUNTER — Encounter
Admission: RE | Disposition: A | Discharge status: Home / Self Care | Payer: Self-pay | Source: Ambulatory Visit | Attending: Gastroenterology

## 2017-02-16 ENCOUNTER — Ambulatory Visit
Admission: RE | Admit: 2017-02-16 | Discharge: 2017-02-16 | Disposition: A | Discharge status: Home / Self Care | Payer: Medicare Other | Source: Ambulatory Visit | Attending: Gastroenterology | Admitting: Gastroenterology

## 2017-02-16 ENCOUNTER — Ambulatory Visit: Payer: Medicare Other | Admitting: Anesthesiology

## 2017-02-16 ENCOUNTER — Encounter: Payer: Self-pay | Admitting: *Deleted

## 2017-02-16 DIAGNOSIS — Z09 Encounter for follow-up examination after completed treatment for conditions other than malignant neoplasm: Secondary | ICD-10-CM | POA: Insufficient documentation

## 2017-02-16 DIAGNOSIS — E669 Obesity, unspecified: Secondary | ICD-10-CM | POA: Diagnosis not present

## 2017-02-16 DIAGNOSIS — Z6834 Body mass index (BMI) 34.0-34.9, adult: Secondary | ICD-10-CM | POA: Insufficient documentation

## 2017-02-16 DIAGNOSIS — K6289 Other specified diseases of anus and rectum: Secondary | ICD-10-CM | POA: Diagnosis present

## 2017-02-16 DIAGNOSIS — Z86010 Personal history of colonic polyps: Secondary | ICD-10-CM | POA: Insufficient documentation

## 2017-02-16 DIAGNOSIS — I10 Essential (primary) hypertension: Secondary | ICD-10-CM | POA: Insufficient documentation

## 2017-02-16 DIAGNOSIS — K621 Rectal polyp: Secondary | ICD-10-CM | POA: Insufficient documentation

## 2017-02-16 DIAGNOSIS — K5730 Diverticulosis of large intestine without perforation or abscess without bleeding: Secondary | ICD-10-CM | POA: Diagnosis not present

## 2017-02-16 DIAGNOSIS — G4733 Obstructive sleep apnea (adult) (pediatric): Secondary | ICD-10-CM | POA: Diagnosis not present

## 2017-02-16 DIAGNOSIS — D123 Benign neoplasm of transverse colon: Secondary | ICD-10-CM | POA: Diagnosis not present

## 2017-02-16 DIAGNOSIS — Z79899 Other long term (current) drug therapy: Secondary | ICD-10-CM | POA: Diagnosis not present

## 2017-02-16 DIAGNOSIS — K635 Polyp of colon: Secondary | ICD-10-CM | POA: Diagnosis not present

## 2017-02-16 DIAGNOSIS — K640 First degree hemorrhoids: Secondary | ICD-10-CM | POA: Insufficient documentation

## 2017-02-16 DIAGNOSIS — Z888 Allergy status to other drugs, medicaments and biological substances status: Secondary | ICD-10-CM | POA: Insufficient documentation

## 2017-02-16 HISTORY — PX: FLEXIBLE SIGMOIDOSCOPY: SHX5431

## 2017-02-16 SURGERY — SIGMOIDOSCOPY, FLEXIBLE
Anesthesia: General

## 2017-02-16 MED ORDER — FENTANYL CITRATE (PF) 100 MCG/2ML IJ SOLN
INTRAMUSCULAR | Status: DC | PRN
  Administered 2017-02-16 (×2): 50 ug via INTRAVENOUS

## 2017-02-16 MED ORDER — PROPOFOL 500 MG/50ML IV EMUL
INTRAVENOUS | Status: DC | PRN
Start: 2017-02-16 — End: 2017-02-16

## 2017-02-16 MED ORDER — FENTANYL CITRATE (PF) 100 MCG/2ML IJ SOLN
INTRAMUSCULAR | Status: AC
  Filled 2017-02-16: qty 2

## 2017-02-16 MED ORDER — MIDAZOLAM HCL 5 MG/5ML IJ SOLN
INTRAMUSCULAR | Status: DC | PRN
  Administered 2017-02-16 (×2): 1 mg via INTRAVENOUS

## 2017-02-16 MED ORDER — PROPOFOL 10 MG/ML IV BOLUS
INTRAVENOUS | Status: DC | PRN
  Administered 2017-02-16: 100 mg via INTRAVENOUS

## 2017-02-16 MED ORDER — SODIUM CHLORIDE 0.9 % IV SOLN
INTRAVENOUS | Status: DC
  Administered 2017-02-16 (×2): via INTRAVENOUS

## 2017-02-16 MED ORDER — LIDOCAINE 2% (20 MG/ML) 5 ML SYRINGE
INTRAMUSCULAR | Status: DC | PRN
  Administered 2017-02-16: 40 mg via INTRAVENOUS

## 2017-02-16 MED ORDER — PROPOFOL 500 MG/50ML IV EMUL
INTRAVENOUS | Status: DC | PRN
  Administered 2017-02-16: 160 ug/kg/min via INTRAVENOUS

## 2017-02-16 MED ORDER — PROPOFOL 500 MG/50ML IV EMUL
INTRAVENOUS | Status: AC
  Filled 2017-02-16: qty 50

## 2017-02-16 MED ORDER — MIDAZOLAM HCL 2 MG/2ML IJ SOLN
INTRAMUSCULAR | Status: AC
  Filled 2017-02-16: qty 2

## 2017-02-16 NOTE — Transfer of Care (Signed)
Immediate Anesthesia Transfer of Care Note  Patient: Steven Downs  Procedure(s) Performed: Procedure(s): FLEXIBLE SIGMOIDOSCOPY (N/A)  Patient Location: PACU and Endoscopy Unit  Anesthesia Type:General  Level of Consciousness: sedated  Airway & Oxygen Therapy: Patient Spontanous Breathing and Patient connected to nasal cannula oxygen  Post-op Assessment: Report given to RN and Post -op Vital signs reviewed and stable  Post vital signs: Reviewed and stable  Last Vitals:  Vitals:   02/16/17 1025  BP: 128/87  Pulse: 81  Resp: 16  Temp: 36.6 C    Last Pain:  Vitals:   02/16/17 1025  TempSrc: Tympanic         Complications: No apparent anesthesia complications

## 2017-02-16 NOTE — Op Note (Signed)
[facility] Gastroenterology Patient Name: Steven Downs Procedure Date: 02/16/2017 7:24 AM MRN: [id number] Account #: [id number] Date of Birth: 31-Jan-1961 Admit Type: Outpatient Age: 56 Room: [facility] ENDO ROOM 1 Gender: Male Note Status: Finalized Procedure:            Flexible Sigmoidoscopy Indications:          Personal history of colonic polyps Providers:            [name], MD Referring MD:         No Local Md, MD (Referring MD) Medicines:            Monitored Anesthesia Care Complications:        No immediate complications. Procedure:            Pre-Anesthesia Assessment:                       - ASA Grade Assessment: III - A patient with severe                        systemic disease.                       After obtaining informed consent, the scope was passed                        under direct vision. The Colonoscope was introduced                        through the anus and advanced to the right colon, cecum                        seen from a distance. The flexible sigmoidoscopy was                        accomplished without difficulty. The patient tolerated                        the procedure well. The quality of the bowel                        preparation was good except the proximal transverse                        colon was fair. Findings:      Multiple small-mouthed diverticula were found in the sigmoid colon and       descending colon.      A 3 mm polyp was found in the proximal sigmoid colon. The polyp was       sessile. The polyp was removed with a cold biopsy forceps. Resection and       retrieval were complete.      A 3 mm polyp was found in the splenic flexure. The polyp was flat. The       polyp was removed with a cold biopsy forceps. Resection and retrieval       were complete.      A 1 mm polyp was found in the rectum. The polyp was sessile. The polyp       was removed with a cold biopsy forceps. Resection and retrieval were        complete.  Non-bleeding internal hemorrhoids were found during anoscopy. The       hemorrhoids were small and Grade I (internal hemorrhoids that do not       prolapse).      The digital rectal exam was normal. Impression:           - Diverticulosis in the sigmoid colon and in the                        descending colon.                       - One 3 mm polyp in the proximal sigmoid colon, removed                        with a cold biopsy forceps. Resected and retrieved.                       - One 3 mm polyp at the splenic flexure, removed with a                        cold biopsy forceps. Resected and retrieved.                       - One 1 mm polyp in the rectum, removed with a cold                        biopsy forceps. Resected and retrieved.                       - Non-bleeding internal hemorrhoids. Recommendation:       - Await pathology results.                       - Telephone GI clinic for pathology results in 1 week. Procedure Code(s):    --- Professional ---                       [phone], Sigmoidoscopy, flexible; with biopsy, single or                        multiple Diagnosis Code(s):    --- Professional ---                       K64.0, First degree hemorrhoids                       D12.5, Benign neoplasm of sigmoid colon                       D12.3, Benign neoplasm of transverse colon (hepatic                        flexure or splenic flexure)                       K62.1, Rectal polyp                       Z86.010, Personal history of colonic polyps  K57.30, Diverticulosis of large intestine without                        perforation or abscess without bleeding CPT copyright 2016 American Medical Association. All rights reserved. The codes documented in this report are preliminary and upon coder review may  be revised to meet current compliance requirements. [name], MD 02/16/2017 12:42:14 PM This report has been signed  electronically. Number of Addenda: 0 Note Initiated On: 02/16/2017 7:24 AM Scope Withdrawal Time: 0 hours 8 minutes 10 seconds  Total Procedure Duration: 0 hours 24 minutes 14 seconds       [facility]

## 2017-02-16 NOTE — Anesthesia Post-op Follow-up Note (Cosign Needed)
Anesthesia QCDR form completed.        

## 2017-02-16 NOTE — H&P (Signed)
Outpatient short stay form Pre-procedure 02/16/2017 11:50 AM Steven Sails MD  Primary Physician: Mercy Riding NP  Reason for visit:  Sedated flexible sigmoidoscopy  History of present illness:  Patient is a 56 year old male presenting today as above. He has personal history of a recent colonoscopy with multiple colon polyps. One of these was 18 mm in size and showed evidence of high-grade dysplasia on histology. He is returning for recheck on this site. Had multiple other polyps only tube which were adenoma. He tolerated his prep well. He takes no aspirin or blood thinning agents.    Current Facility-Administered Medications:  .  0.9 %  sodium chloride infusion, , Intravenous, Continuous, Steven Sails, MD, Last Rate: 20 mL/hr at 02/16/17 1042  Prescriptions Prior to Admission  Medication Sig Dispense Refill Last Dose  . valsartan-hydrochlorothiazide (DIOVAN-HCT) 320-25 MG tablet Take 1 tablet by mouth daily.   02/15/2017 at Unknown time  . cetirizine (ZYRTEC) 10 MG tablet Take 10 mg by mouth daily.   12/15/2016 at Unknown time  . naproxen sodium (ANAPROX) 220 MG tablet Take 220 mg by mouth 2 (two) times daily with a meal. 2 TABLETS BY MOUTH AS NEEDED   02/10/2017     Allergies  Allergen Reactions  . Ace Inhibitors Cough     Past Medical History:  Diagnosis Date  . Central serous retinopathy, left   . Chlamydia FRACTURE OF METACARPAL BONE  . Chronic headaches   . Depression   . GERD (gastroesophageal reflux disease) COLOR BLINDNESS  . Hip pain   . History of color blindness   . Hypertension   . Maculopathy   . Obesity   . OSA (obstructive sleep apnea)     Review of systems:      Physical Exam    Heart and lungs: Regular rate and rhythm without rub or gallop, lungs are bilaterally clear.    HEENT: Normocephalic atraumatic eyes are anicteric    Other:     Pertinant exam for procedure: Soft nontender nondistended bowel sounds positive  normoactive.    Planned proceedures: Flexible sigmoidoscopy and indicated procedures.    Steven Sails, MD Gastroenterology 02/16/2017  11:50 AM

## 2017-02-16 NOTE — Anesthesia Preprocedure Evaluation (Signed)
Anesthesia Evaluation  Patient identified by MRN, date of birth, ID band Patient awake    Reviewed: Allergy & Precautions, NPO status , Patient's Chart, lab work & pertinent test results  History of Anesthesia Complications Negative for: history of anesthetic complications  Airway Mallampati: II  TM Distance: >3 FB Neck ROM: Full    Dental  (+) Missing   Pulmonary sleep apnea and Continuous Positive Airway Pressure Ventilation , Current Smoker,    breath sounds clear to auscultation- rhonchi (-) wheezing      Cardiovascular hypertension, Pt. on medications (-) CAD and (-) Past MI  Rhythm:Regular Rate:Normal - Systolic murmurs and - Diastolic murmurs    Neuro/Psych  Headaches, PSYCHIATRIC DISORDERS Depression    GI/Hepatic Neg liver ROS, GERD  ,  Endo/Other  negative endocrine ROSneg diabetes  Renal/GU negative Renal ROS     Musculoskeletal negative musculoskeletal ROS (+)   Abdominal (+) + obese,   Peds  Hematology negative hematology ROS (+)   Anesthesia Other Findings Past Medical History: No date: Central serous retinopathy, left FRACTURE OF METACARPAL BONE: Chlamydia No date: Chronic headaches No date: Depression COLOR BLINDNESS: GERD (gastroesophageal reflux disease) No date: Hip pain No date: History of color blindness No date: Hypertension No date: Maculopathy No date: Obesity No date: OSA (obstructive sleep apnea)   Reproductive/Obstetrics                             Anesthesia Physical  Anesthesia Plan  ASA: III  Anesthesia Plan: General   Post-op Pain Management:    Induction: Intravenous  PONV Risk Score and Plan:   Airway Management Planned: Natural Airway  Additional Equipment:   Intra-op Plan:   Post-operative Plan:   Informed Consent: I have reviewed the patients History and Physical, chart, labs and discussed the procedure including the risks,  benefits and alternatives for the proposed anesthesia with the patient or authorized representative who has indicated his/her understanding and acceptance.   Dental advisory given  Plan Discussed with: CRNA and Anesthesiologist  Anesthesia Plan Comments: (Dr. Gustavo Lah has requested anesthesia support for this patients care during the procedure.   Patient consented for risks of anesthesia including but not limited to:  - adverse reactions to medications - risk of intubation if required - damage to teeth, lips or other oral mucosa - sore throat or hoarseness - Damage to heart, brain, lungs or loss of life  Patient voiced understanding.)        Anesthesia Quick Evaluation

## 2017-02-17 ENCOUNTER — Encounter: Payer: Self-pay | Admitting: Gastroenterology

## 2017-02-17 NOTE — Anesthesia Postprocedure Evaluation (Signed)
Anesthesia Post Note  Patient: Steven Downs  Procedure(s) Performed: Procedure(s) (LRB): FLEXIBLE SIGMOIDOSCOPY (N/A)  Patient location during evaluation: PACU Anesthesia Type: General Level of consciousness: awake and alert and oriented Pain management: pain level controlled Vital Signs Assessment: post-procedure vital signs reviewed and stable Respiratory status: spontaneous breathing Cardiovascular status: blood pressure returned to baseline Anesthetic complications: no     Last Vitals:  Vitals:   02/16/17 1257 02/16/17 1307  BP: 123/86 112/78  Pulse: 62 64  Resp: 15 14  Temp:      Last Pain:  Vitals:   02/17/17 0729  TempSrc:   PainSc: 0-No pain                 Terek Bee

## 2017-02-18 LAB — SURGICAL PATHOLOGY
